# Patient Record
Sex: Male | Born: 1990 | Race: White | Hispanic: No | Marital: Single
Health system: Southern US, Community
[De-identification: ages and names within clinical notes are randomized; demographics above are authoritative.]

---

## 2013-11-07 ENCOUNTER — Other Ambulatory Visit: Payer: Self-pay | Admitting: Occupational Medicine

## 2013-11-07 ENCOUNTER — Ambulatory Visit
Admission: RE | Admit: 2013-11-07 | Discharge: 2013-11-07 | Disposition: A | Discharge status: Home / Self Care | Payer: No Typology Code available for payment source | Source: Ambulatory Visit | Attending: Occupational Medicine | Admitting: Occupational Medicine

## 2013-11-07 DIAGNOSIS — Z021 Encounter for pre-employment examination: Secondary | ICD-10-CM

## 2015-10-03 IMAGING — CR DG CHEST 1V
1 series · 1 of 1 positions shown · non-contrast
Comparison: None.

CLINICAL DATA: PRE EMPLOYMENT EXAM

EXAM:
CHEST - 1 VIEW

[view not recorded]
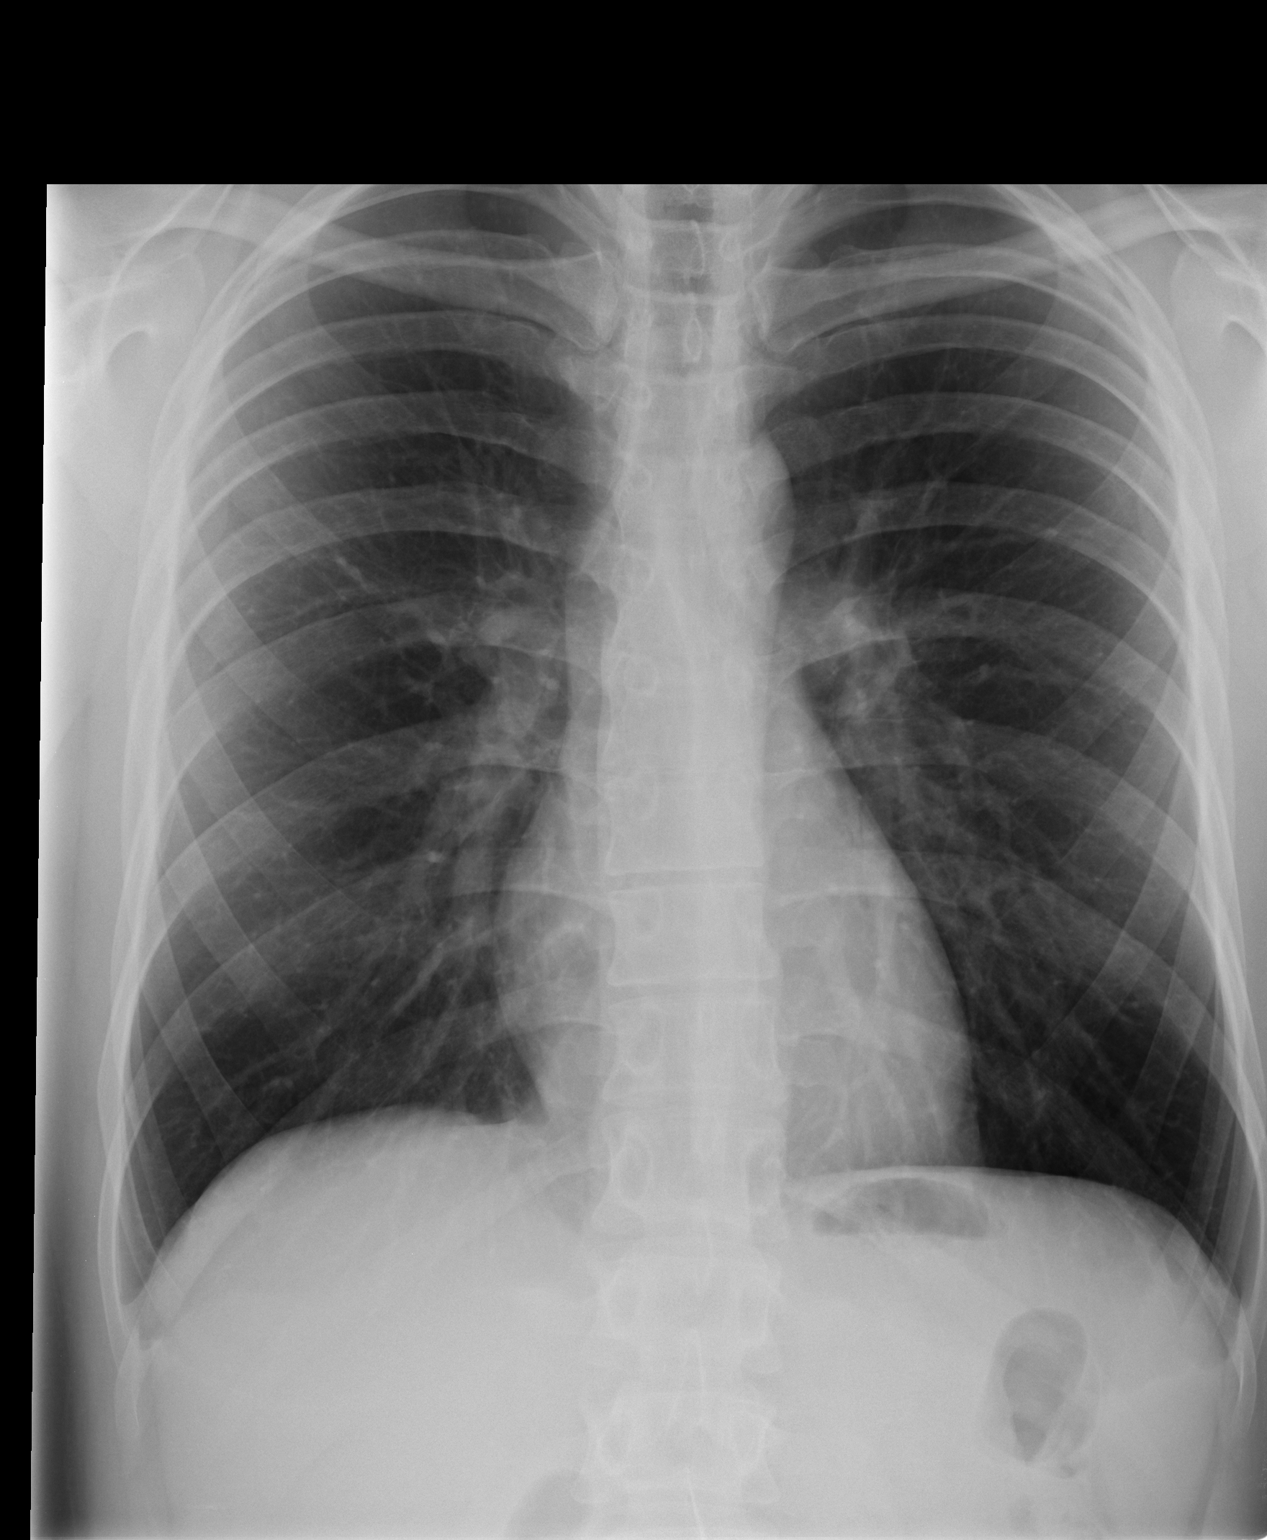

[1 of 1 positions shown; findings below may reference images not displayed]

FINDINGS: The heart size and mediastinal contours are within normal limits.
Both lungs are clear. The visualized skeletal structures are
unremarkable.
IMPRESSION: No active disease.
